# Patient Record
Sex: Male | Born: 1940
Health system: Southern US, Community
[De-identification: ages and names within clinical notes are randomized; demographics above are authoritative.]

## PROBLEM LIST (undated history)

## (undated) DIAGNOSIS — I10 Essential (primary) hypertension: Secondary | ICD-10-CM

## (undated) DIAGNOSIS — J449 Chronic obstructive pulmonary disease, unspecified: Secondary | ICD-10-CM

## (undated) DIAGNOSIS — E119 Type 2 diabetes mellitus without complications: Secondary | ICD-10-CM

## (undated) HISTORY — DX: Type 2 diabetes mellitus without complications: E11.9

## (undated) HISTORY — DX: Essential (primary) hypertension: I10

## (undated) HISTORY — DX: Chronic obstructive pulmonary disease, unspecified: J44.9

---

## 1998-05-28 ENCOUNTER — Other Ambulatory Visit: Admission: RE | Admit: 1998-05-28 | Discharge: 1998-05-28 | Payer: Self-pay | Admitting: Urology

## 1998-06-13 ENCOUNTER — Encounter: Payer: Self-pay | Admitting: Urology

## 1998-06-18 ENCOUNTER — Inpatient Hospital Stay (HOSPITAL_COMMUNITY): Admission: RE | Admit: 1998-06-18 | Discharge: 1998-06-22 | Payer: Self-pay | Admitting: Urology

## 1998-07-06 ENCOUNTER — Emergency Department (HOSPITAL_COMMUNITY): Admission: EM | Admit: 1998-07-06 | Discharge: 1998-07-06 | Payer: Self-pay | Admitting: Emergency Medicine

## 1998-07-06 ENCOUNTER — Emergency Department (HOSPITAL_COMMUNITY): Admission: EM | Admit: 1998-07-06 | Discharge: 1998-07-06 | Payer: Self-pay

## 1998-07-07 ENCOUNTER — Emergency Department (HOSPITAL_COMMUNITY): Admission: EM | Admit: 1998-07-07 | Discharge: 1998-07-07 | Payer: Self-pay | Admitting: Emergency Medicine

## 1999-03-24 ENCOUNTER — Emergency Department (HOSPITAL_COMMUNITY): Admission: EM | Admit: 1999-03-24 | Discharge: 1999-03-24 | Payer: Self-pay | Admitting: Emergency Medicine

## 2001-09-17 ENCOUNTER — Emergency Department (HOSPITAL_COMMUNITY): Admission: EM | Admit: 2001-09-17 | Discharge: 2001-09-17 | Payer: Self-pay | Admitting: Emergency Medicine

## 2001-09-17 ENCOUNTER — Encounter: Payer: Self-pay | Admitting: Emergency Medicine

## 2002-01-11 ENCOUNTER — Emergency Department (HOSPITAL_COMMUNITY): Admission: EM | Admit: 2002-01-11 | Discharge: 2002-01-11 | Payer: Self-pay | Admitting: Emergency Medicine

## 2003-06-06 ENCOUNTER — Encounter (INDEPENDENT_AMBULATORY_CARE_PROVIDER_SITE_OTHER): Payer: Self-pay | Admitting: Specialist

## 2003-06-06 ENCOUNTER — Ambulatory Visit (HOSPITAL_COMMUNITY): Admission: RE | Admit: 2003-06-06 | Discharge: 2003-06-06 | Payer: Self-pay | Admitting: Gastroenterology

## 2003-08-03 ENCOUNTER — Emergency Department (HOSPITAL_COMMUNITY): Admission: EM | Admit: 2003-08-03 | Discharge: 2003-08-03 | Payer: Self-pay | Admitting: Emergency Medicine

## 2003-09-04 ENCOUNTER — Encounter: Admission: RE | Admit: 2003-09-04 | Discharge: 2003-09-04 | Payer: Self-pay | Admitting: Emergency Medicine

## 2004-03-15 ENCOUNTER — Inpatient Hospital Stay (HOSPITAL_COMMUNITY): Admission: EM | Admit: 2004-03-15 | Discharge: 2004-03-20 | Payer: Self-pay | Admitting: Emergency Medicine

## 2005-11-20 IMAGING — CR DG CHEST 2V
2 series · 2 of 2 positions shown · non-contrast
Comparison: none

CLINICAL DATA: fever; decreased appetite; dyspnea
 TWO VIEW CHEST 08/03/03
 Comparison to 06/13/98.

[view not recorded (1 of 2)]
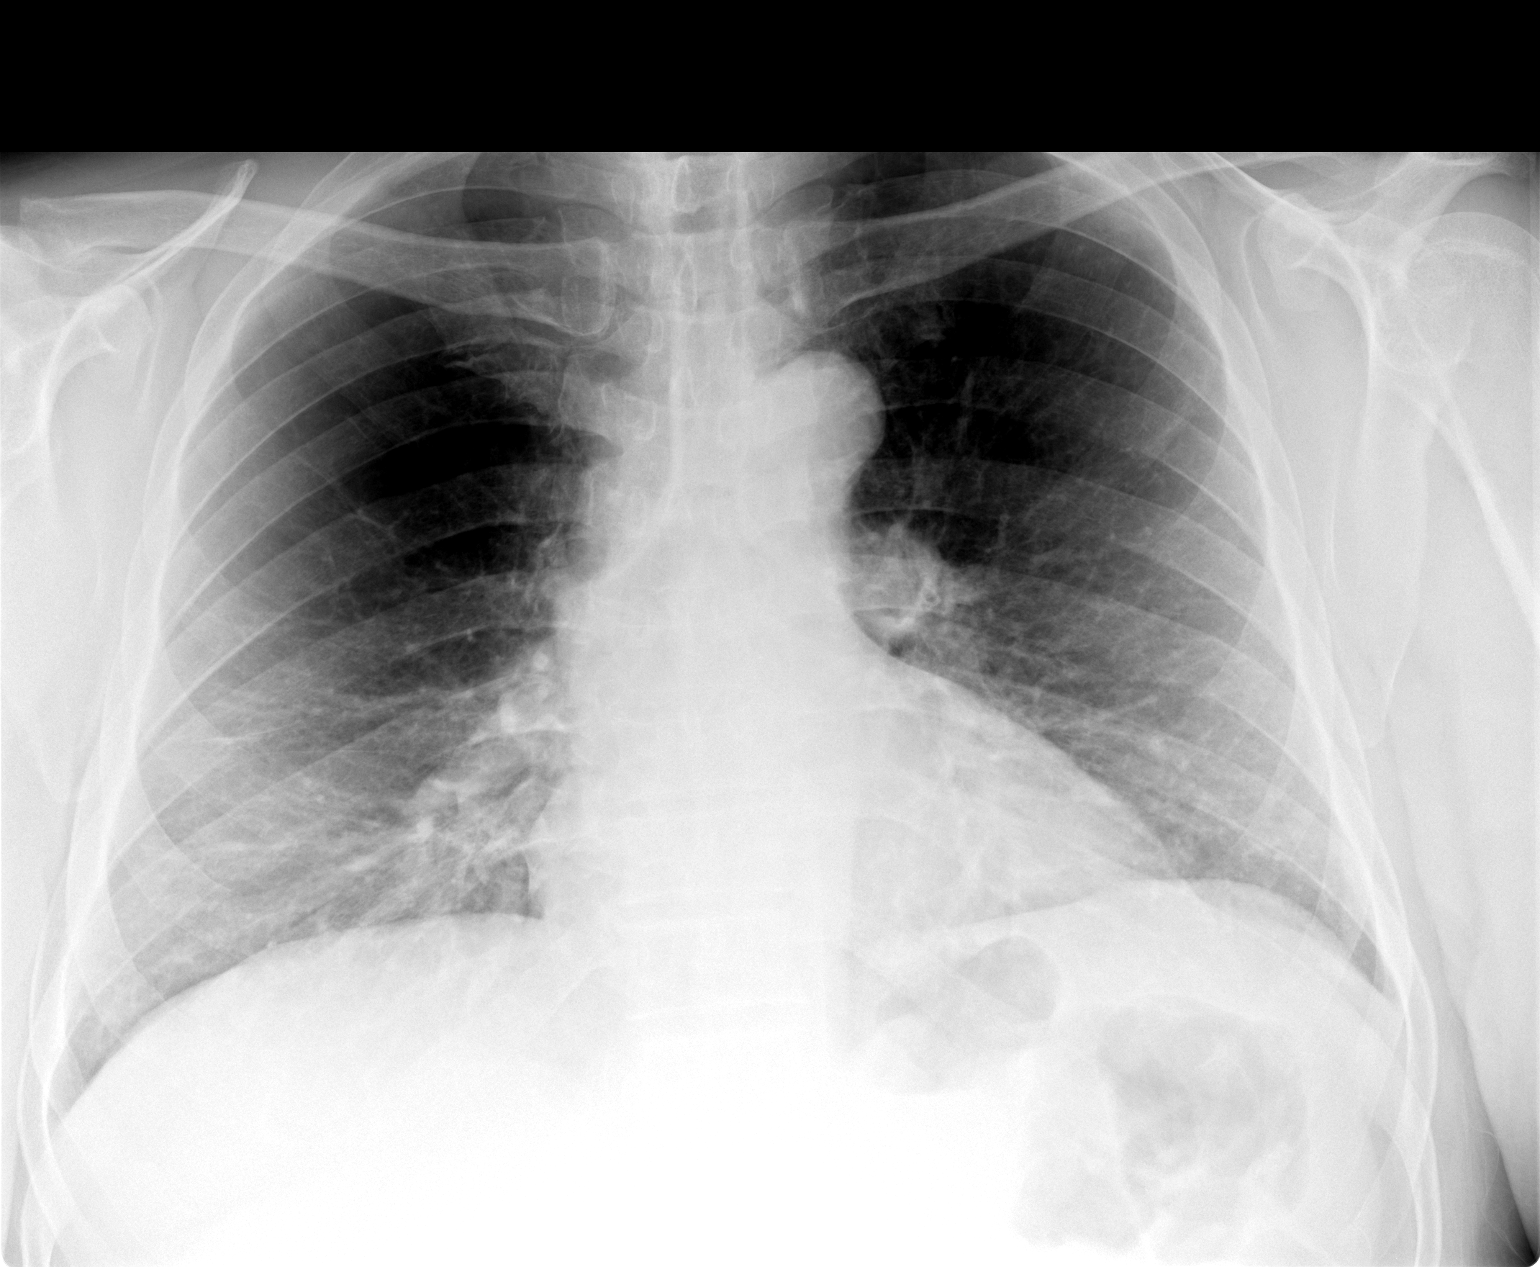

[view not recorded (2 of 2)]
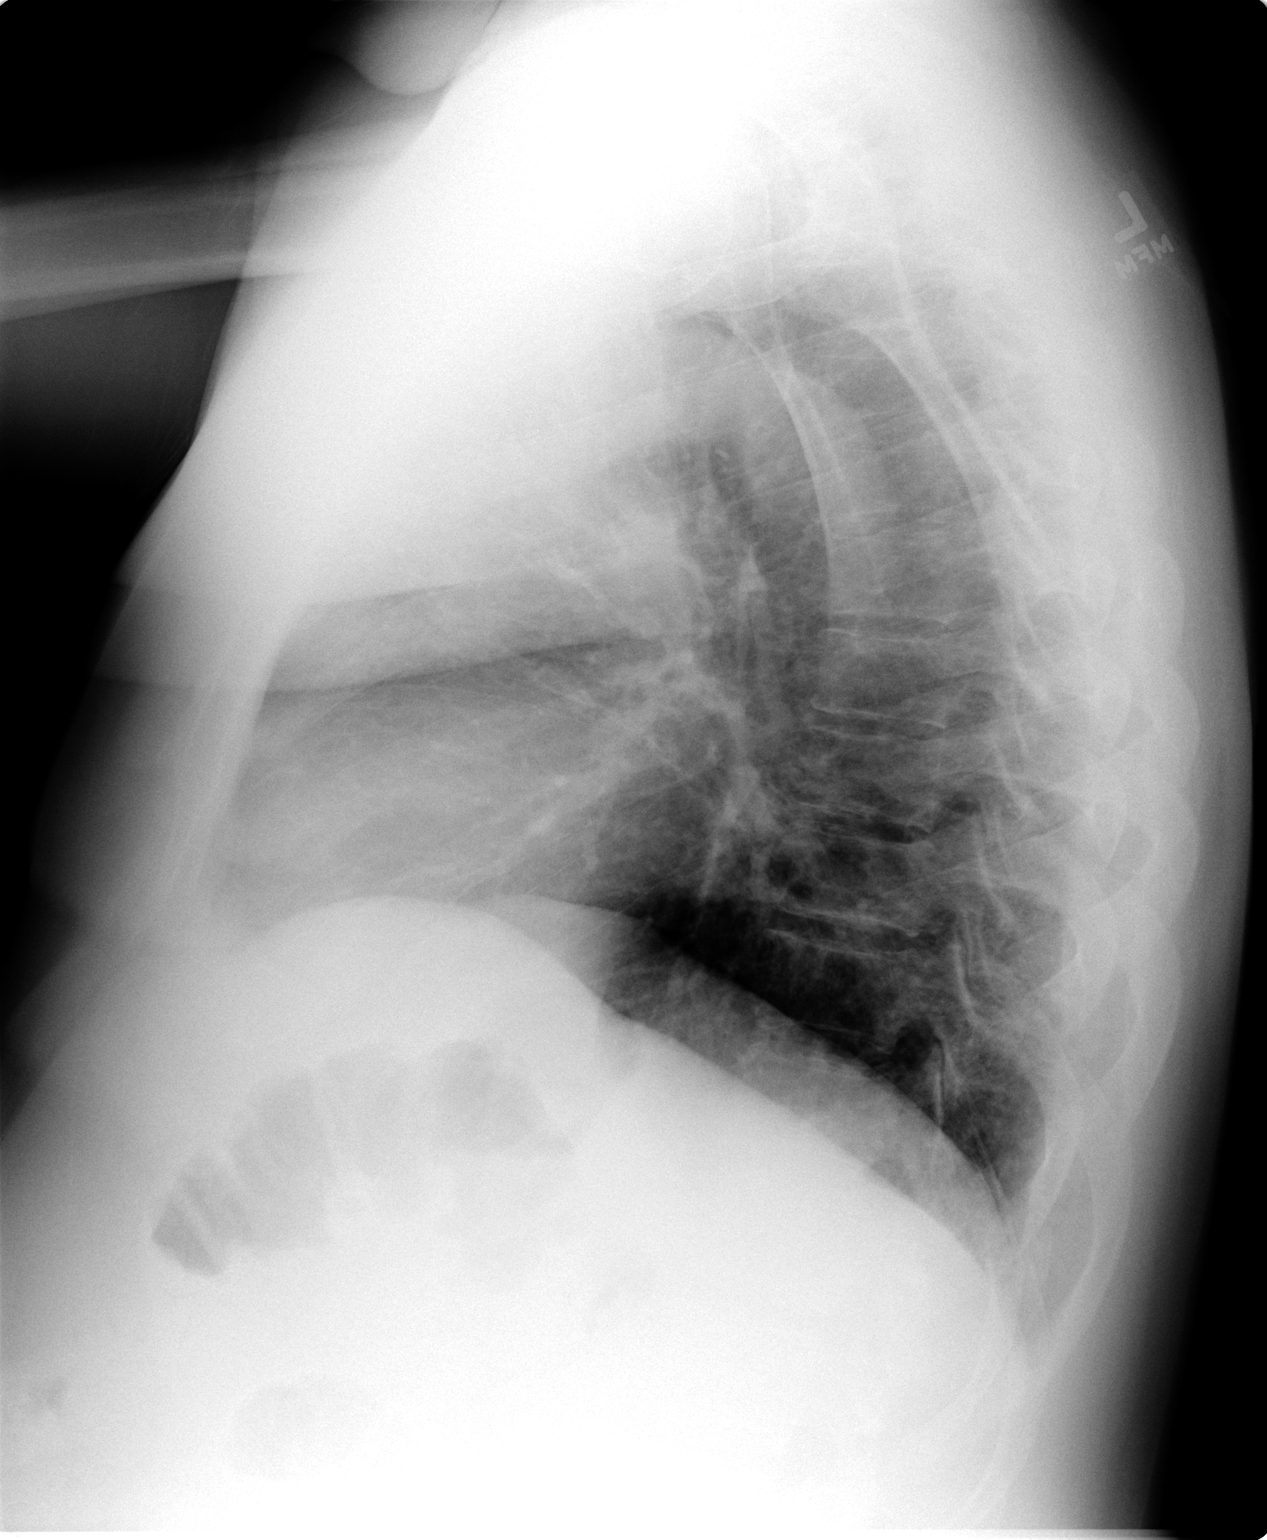

[2 of 2 positions shown; findings below may reference images not displayed]

FINDINGS: Mild cardiomegaly and chronic peribronchial thickening and evidence of COPD noted.  There has been no interval change from the last study.
 IMPRESSION
 No evidence of acute cardiopulmonary disease.
 Stable cardiomegaly, chronic peribronchial thickening, COPD.

## 2007-05-08 ENCOUNTER — Encounter: Admission: RE | Admit: 2007-05-08 | Discharge: 2007-05-08 | Payer: Self-pay | Admitting: Orthopaedic Surgery

## 2007-05-09 ENCOUNTER — Encounter: Admission: RE | Admit: 2007-05-09 | Discharge: 2007-05-09 | Payer: Self-pay | Admitting: Orthopaedic Surgery

## 2009-01-13 ENCOUNTER — Encounter: Admission: RE | Admit: 2009-01-13 | Discharge: 2009-01-13 | Payer: Self-pay | Admitting: Family Medicine

## 2009-01-17 ENCOUNTER — Encounter: Admission: RE | Admit: 2009-01-17 | Discharge: 2009-01-17 | Payer: Self-pay | Admitting: Family Medicine

## 2009-02-14 ENCOUNTER — Encounter: Admission: RE | Admit: 2009-02-14 | Discharge: 2009-02-25 | Payer: Self-pay | Admitting: Orthopaedic Surgery

## 2009-06-09 ENCOUNTER — Emergency Department (HOSPITAL_COMMUNITY): Admission: EM | Admit: 2009-06-09 | Discharge: 2009-06-09 | Payer: Self-pay | Admitting: Emergency Medicine

## 2009-10-22 ENCOUNTER — Ambulatory Visit (HOSPITAL_COMMUNITY): Admission: RE | Admit: 2009-10-22 | Discharge: 2009-10-22 | Payer: Self-pay | Admitting: Neurosurgery

## 2009-11-21 ENCOUNTER — Inpatient Hospital Stay (HOSPITAL_COMMUNITY): Admission: RE | Admit: 2009-11-21 | Discharge: 2009-11-22 | Payer: Self-pay | Admitting: Neurosurgery

## 2010-07-04 LAB — BASIC METABOLIC PANEL
BUN: 15 mg/dL (ref 6–23)
CO2: 28 mEq/L (ref 19–32)
Calcium: 9.5 mg/dL (ref 8.4–10.5)
GFR calc Af Amer: >60 mL/min (ref >60)
GFR calc non Af Amer: >60 mL/min (ref >60)
Glucose, Bld: 138 mg/dL — ABNORMAL HIGH (ref 70–99)
Sodium: 141 mEq/L (ref 135–145)

## 2010-07-04 LAB — CBC
Hemoglobin: 14.3 g/dL (ref 13.0–17.0)
MCH: 30.8 pg (ref 26.0–34.0)
WBC: 8.2 10*3/uL (ref 4.0–10.5)

## 2010-07-04 LAB — SURGICAL PCR SCREEN
MRSA, PCR: NEGATIVE
Staphylococcus aureus: NEGATIVE

## 2010-07-05 LAB — BASIC METABOLIC PANEL
Calcium: 9.3 mg/dL (ref 8.4–10.5)
Chloride: 105 mEq/L (ref 96–112)
GFR calc non Af Amer: >60 mL/min (ref >60)
Potassium: 4.1 mEq/L (ref 3.5–5.1)

## 2010-07-05 LAB — CBC
MCH: 31.2 pg (ref 26.0–34.0)
MCV: 90.5 fL (ref 78.0–100.0)
Platelets: 210 10*3/uL (ref 150–400)
RDW: 12.8 % (ref 11.5–15.5)

## 2010-07-05 LAB — SURGICAL PCR SCREEN: Staphylococcus aureus: NEGATIVE

## 2010-09-04 NOTE — Op Note (Signed)
NAME:  Juan Romero, Juan Romero                          ACCOUNT NO.:  [id number]   MEDICAL RECORD NO.:  [id number]                   PATIENT TYPE:  AMB   LOCATION:  ENDO                                 FACILITY:  [facility]   PHYSICIAN:  John C. Madilyn Fireman, M.D.                 DATE OF BIRTH:  Jul 20, 1940   DATE OF PROCEDURE:  06/06/2003  DATE OF DISCHARGE:                                 OPERATIVE REPORT   PROCEDURE:  Colonoscopy with polypectomy.   INDICATIONS FOR PROCEDURE:  Colon cancer screening in a 70 year old patient  with no prior screening.   DESCRIPTION OF PROCEDURE:  The patient was placed in the left lateral  decubitus position then placed on the pulse monitor with continuous low flow  oxygen delivered by nasal cannula. He was sedated with 40 mcg IV fentanyl  and 4 mg IV Versed. The Olympus video colonoscope was inserted into the  rectum and advanced to the level of the ileocecal valve but despite multiple  position changes and abdominal pressure and torquing maneuvers, I could not  reach the extreme base of the cecum and the prep in that area seemed  somewhat suboptimal.  I could thus not rule out lesions within the base of  the cecum especially just proximal to the ileocecal valve.  Otherwise the  visualized portion of the cecum, ascending, transverse and descending colon  appeared normal with no polyps, masses, diverticula or other mucosal  abnormalities.  In the sigmoid colon, there was a pedunculated 1.5 cm polyp  removed by snare. The remainder of the sigmoid appeared normal.  At  approximately 12 mm in the rectum, there was a 6 mm polyp that was removed  by snare. The remainder of the rectum appeared normal.  The scope was then  withdrawn and the patient returned to the recovery room in stable condition.  He tolerated the procedure well and there were no immediate complications.   IMPRESSION:  Sigmoid and rectal polyp.   PLAN:  Await histology to determine method and interval for  future colon  screening.                                               John C. Madilyn Fireman, M.D.    JCH/MEDQ  D:  06/06/2003  T:  06/06/2003  Job:  [id number]   cc:   Jethro Bastos, M.D.  [address]  [place]  [place] [address]  Fax: [phone]

## 2010-09-04 NOTE — Discharge Summary (Signed)
Juan, Romero NO.:  [id number]   MEDICAL RECORD NO.:  [id number]          PATIENT TYPE:  INP   LOCATION:  0361                         FACILITY:  [facility]   PHYSICIAN:  Sherin Quarry, MD      DATE OF BIRTH:  01/23/1941   DATE OF ADMISSION:  03/15/2004  DATE OF DISCHARGE:                                 DISCHARGE SUMMARY   REASON FOR ADMISSION:  Juan Romero is a very pleasant 70 year old man who  is generally in good health.  He seems to be very compliant with his  medications in general.  He initially presented on November 27 with a 24-  hour history of chills, about a four or five day history of cloudy urine and  with temperatures up to 104 on the day of admission.  There was no  associated nausea, vomiting, back pain or respiratory complaints.  In the  emergency room, studies obtained included a urinalysis which showed moderate  hemorrhage, was positive for nitrites had large leukocyte esterase.  The  microscopic exam revealed evidence of pyuria.  The patient's white count was  13,700, hemoglobin 13.7.  He was, therefore, admitted with the presumptive  diagnosis of urinary tract infection with pyelonephritis.   Subsequently, urine culture grew greater than 100,000 colonies of E coli  which was sensitive to all antibiotics tested.  One set of blood cultures  also grew E coli with the same sensitivities.  An ultrasound was obtained  which was normal of the kidneys.   HOSPITAL COURSE:  On admission, Dr. Kevan Romero placed the patient on IV normal  saline with 10 mEq of KCl at 100 ml/hour, Cipro 400 mg IV every 12 hours was  begun.  His usual medications were continued.  On November 28, the patient  had an episode of dyspnea.  This may have been secondary to bacteremia.  A  chest x-ray was obtained and was normal.  At that time to provide  comprehensive coverage for possible pathogens, Dr. Ladona Romero added Zosyn 3.375  g every six hours.  When the sensitivities on  the blood and urine islets  were obtained on November 30, the patient's Zosyn was discontinued and Cipro  was continued.  By December 2, the patient was afebrile.  He felt well.  He  was having no nausea, no back pain, no dysuria, or hematuria.  He was having  no difficulty urinating.  For this reason, he appeared to be a candidate for  discharge.   At the time of discharge, I emphasized to him the importance of taking  antibiotic medication until it was all completed.   DISCHARGE DIAGNOSES:  1.  Urinary tract infection of probable prostate origin with associated gram      negative sepsis.  2.  Hypertension.  3.  Type 2 diabetes.  4.  History of prostate cancer, status post surgical treatment per Dr.      Aldean Romero.  5.  Obesity.   DISCHARGE MEDICATIONS:  The patient will continue his usual medicines which  consist of:  1.  Lisinopril 20 mg daily.  2.  Glucotrol  XL 5 mg daily.  3.  Maxzide 37.5/25 one daily.  4.  Aspirin one-half tablet daily.  5.  Allegra 60 mg daily.  6.  Metformin 500 mg b.i.d.  7.  Lopid 600 mg b.i.d.  8.  In addition, he will continue Cipro at a dose of 500 mg p.o. b.i.d. for      two additional weeks because of the likely prostatic source of this      infection.   DISCHARGE INSTRUCTIONS:  He was advised to continue to see Dr. Aldean Romero on  a regular basis. He was also advised to keep his planned appointment with  Dr. Dorothe Romero in three weeks.  He was encouraged to return to the hospital if  he should have any  recurrence of fever or urinary tract symptoms.      SY/MEDQ  D:  03/20/2004  T:  03/20/2004  Job:  [id number]   cc:   Juan Romero, M.D.  [address]  [place]  [place] [address]  Fax: [phone]   Juan Romero., M.D.  [address]  [place]  [place] [address]  Fax: [phone]
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
## 2011-09-20 DIAGNOSIS — E119 Type 2 diabetes mellitus without complications: Secondary | ICD-10-CM | POA: Diagnosis not present

## 2011-09-20 DIAGNOSIS — E785 Hyperlipidemia, unspecified: Secondary | ICD-10-CM | POA: Diagnosis not present

## 2011-09-20 DIAGNOSIS — I10 Essential (primary) hypertension: Secondary | ICD-10-CM | POA: Diagnosis not present

## 2011-09-20 DIAGNOSIS — L219 Seborrheic dermatitis, unspecified: Secondary | ICD-10-CM | POA: Diagnosis not present

## 2011-09-20 DIAGNOSIS — C61 Malignant neoplasm of prostate: Secondary | ICD-10-CM | POA: Diagnosis not present

## 2011-09-20 DIAGNOSIS — J449 Chronic obstructive pulmonary disease, unspecified: Secondary | ICD-10-CM | POA: Diagnosis not present

## 2011-09-20 DIAGNOSIS — M5126 Other intervertebral disc displacement, lumbar region: Secondary | ICD-10-CM | POA: Diagnosis not present

## 2011-09-20 DIAGNOSIS — J45909 Unspecified asthma, uncomplicated: Secondary | ICD-10-CM | POA: Diagnosis not present

## 2011-10-27 DIAGNOSIS — L280 Lichen simplex chronicus: Secondary | ICD-10-CM | POA: Diagnosis not present

## 2011-11-22 DIAGNOSIS — E119 Type 2 diabetes mellitus without complications: Secondary | ICD-10-CM | POA: Diagnosis not present

## 2011-11-29 DIAGNOSIS — L280 Lichen simplex chronicus: Secondary | ICD-10-CM | POA: Diagnosis not present

## 2012-02-09 IMAGING — CR DG CHEST 2V
2 series · 2 of 2 positions shown · non-contrast
Comparison: Plain film of the chest 03/16/2004.

CLINICAL DATA: Patient for lumbar surgery.  Cough.

CHEST - 2 VIEW

[view not recorded (1 of 2)]
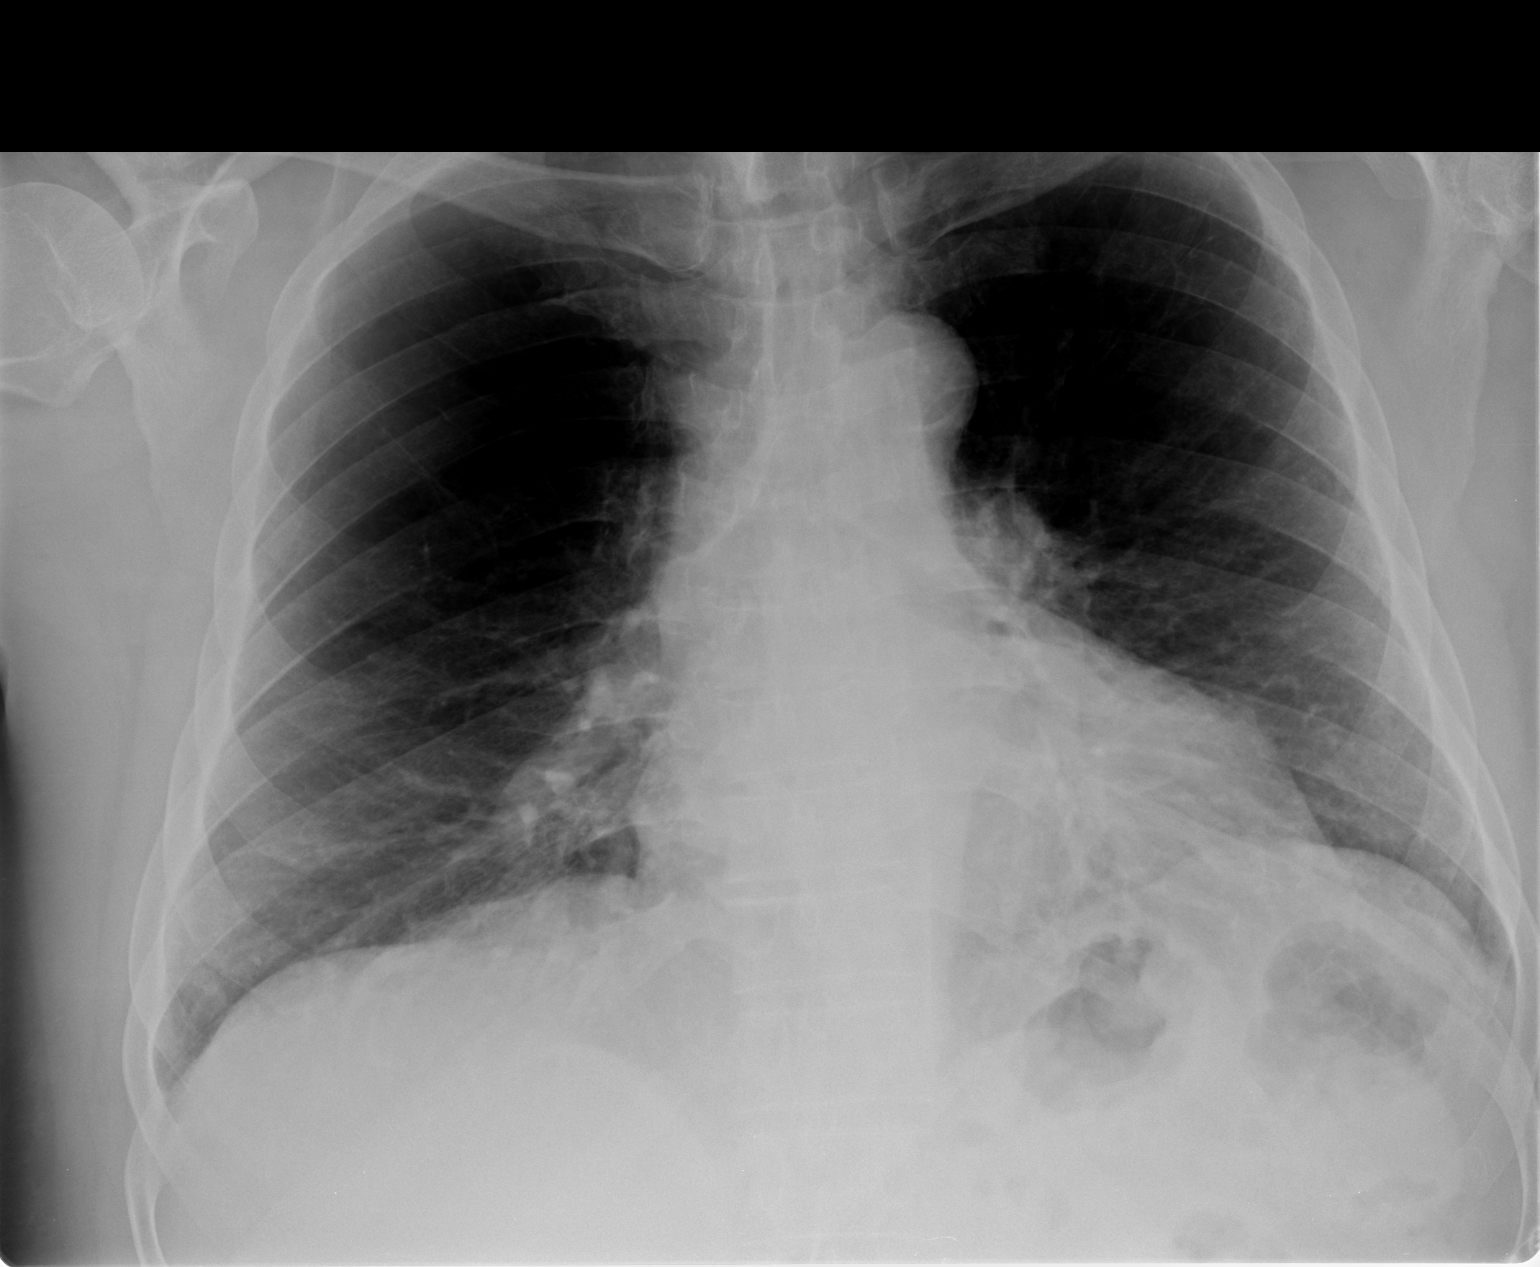

[view not recorded (2 of 2)]
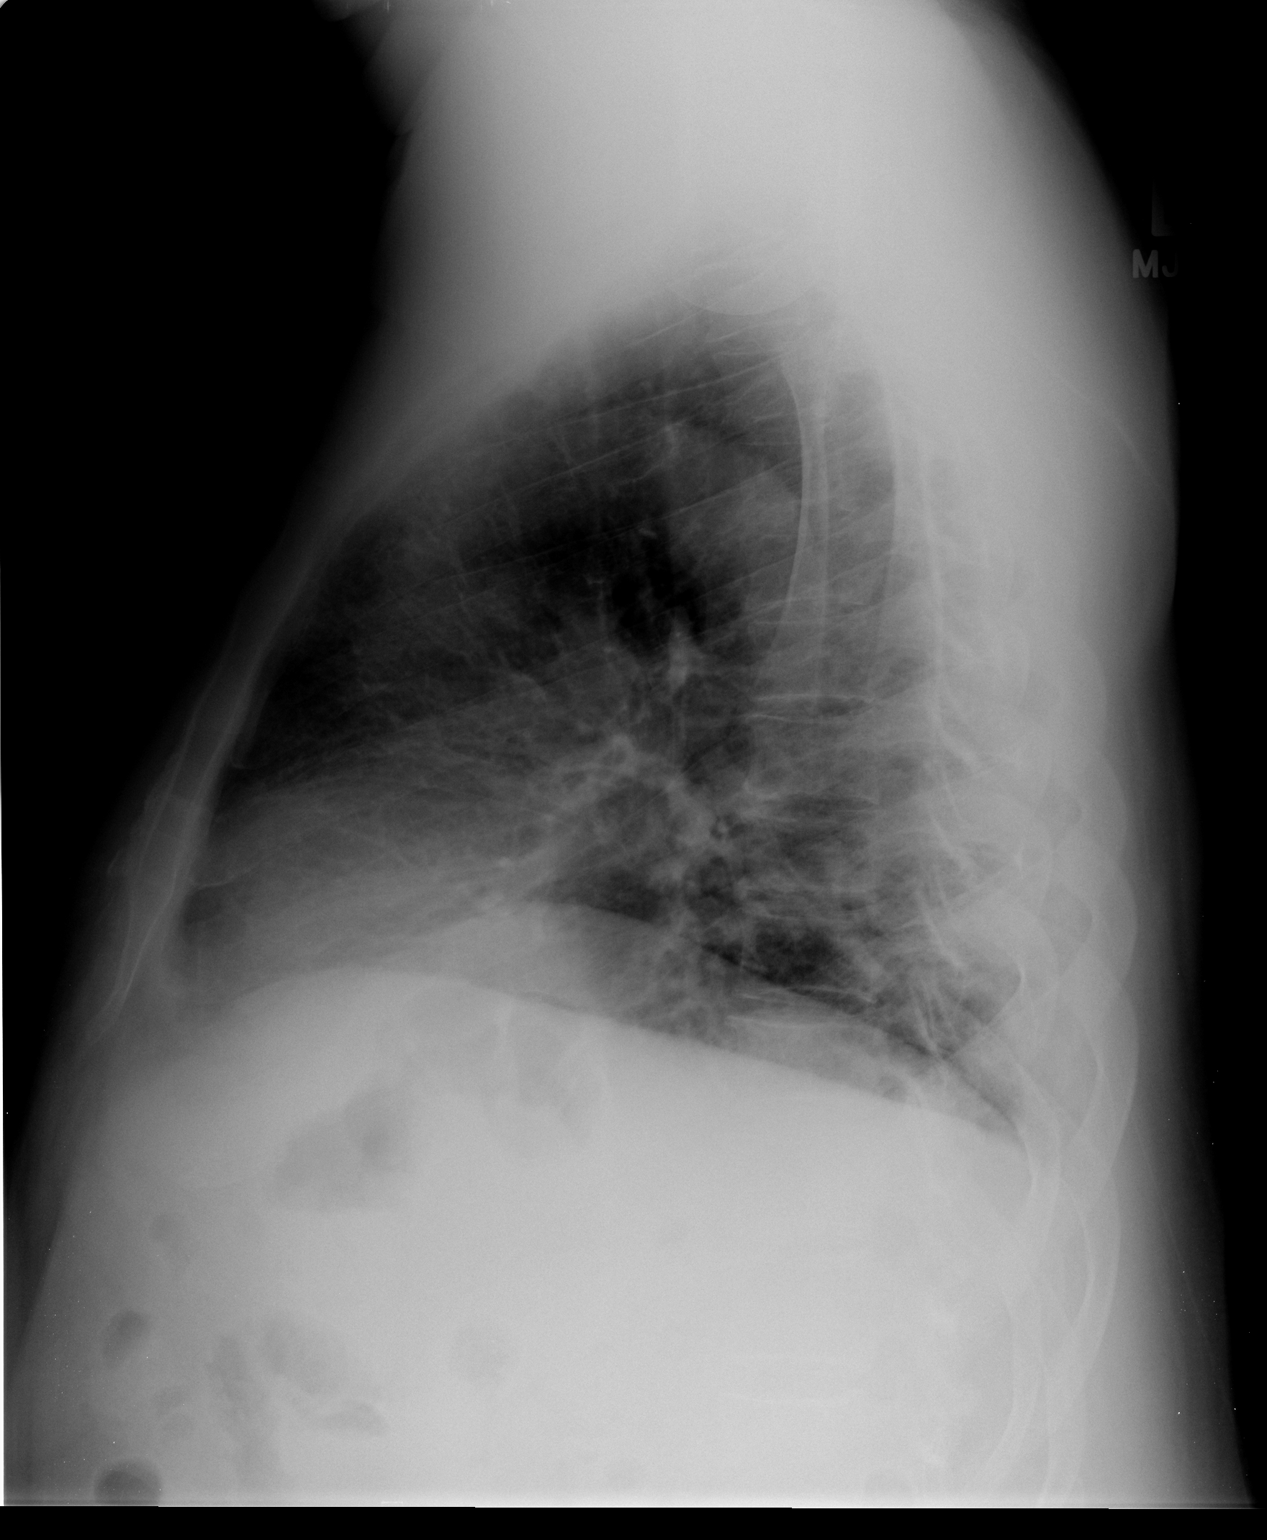

[2 of 2 positions shown; findings below may reference images not displayed]

FINDINGS: There is bullous change in the upper lung zones.  No
focal airspace disease or effusion.  Heart size upper normal.
IMPRESSION: Emphysema without acute disease.

## 2012-02-24 DIAGNOSIS — Z23 Encounter for immunization: Secondary | ICD-10-CM | POA: Diagnosis not present

## 2012-04-25 DIAGNOSIS — E782 Mixed hyperlipidemia: Secondary | ICD-10-CM | POA: Diagnosis not present

## 2012-04-25 DIAGNOSIS — C61 Malignant neoplasm of prostate: Secondary | ICD-10-CM | POA: Diagnosis not present

## 2012-04-25 DIAGNOSIS — Z79899 Other long term (current) drug therapy: Secondary | ICD-10-CM | POA: Diagnosis not present

## 2012-04-25 DIAGNOSIS — I10 Essential (primary) hypertension: Secondary | ICD-10-CM | POA: Diagnosis not present

## 2012-04-28 DIAGNOSIS — Z0000 Encounter for general adult medical examination without abnormal findings: Secondary | ICD-10-CM | POA: Diagnosis not present

## 2012-06-05 DIAGNOSIS — H6120 Impacted cerumen, unspecified ear: Secondary | ICD-10-CM | POA: Diagnosis not present

## 2012-06-05 DIAGNOSIS — H9209 Otalgia, unspecified ear: Secondary | ICD-10-CM | POA: Diagnosis not present

## 2012-06-07 DIAGNOSIS — H6980 Other specified disorders of Eustachian tube, unspecified ear: Secondary | ICD-10-CM | POA: Diagnosis not present

## 2012-06-07 DIAGNOSIS — H905 Unspecified sensorineural hearing loss: Secondary | ICD-10-CM | POA: Diagnosis not present

## 2012-07-05 DIAGNOSIS — H905 Unspecified sensorineural hearing loss: Secondary | ICD-10-CM | POA: Diagnosis not present

## 2012-07-05 DIAGNOSIS — H6980 Other specified disorders of Eustachian tube, unspecified ear: Secondary | ICD-10-CM | POA: Diagnosis not present

## 2012-07-21 DIAGNOSIS — H903 Sensorineural hearing loss, bilateral: Secondary | ICD-10-CM | POA: Diagnosis not present

## 2012-10-17 DIAGNOSIS — Z6837 Body mass index (BMI) 37.0-37.9, adult: Secondary | ICD-10-CM | POA: Diagnosis not present

## 2012-10-17 DIAGNOSIS — I10 Essential (primary) hypertension: Secondary | ICD-10-CM | POA: Diagnosis not present

## 2012-10-17 DIAGNOSIS — Z79899 Other long term (current) drug therapy: Secondary | ICD-10-CM | POA: Diagnosis not present

## 2012-10-17 DIAGNOSIS — C61 Malignant neoplasm of prostate: Secondary | ICD-10-CM | POA: Diagnosis not present

## 2012-10-17 DIAGNOSIS — E785 Hyperlipidemia, unspecified: Secondary | ICD-10-CM | POA: Diagnosis not present

## 2012-10-24 DIAGNOSIS — R42 Dizziness and giddiness: Secondary | ICD-10-CM | POA: Diagnosis not present

## 2012-10-24 DIAGNOSIS — I10 Essential (primary) hypertension: Secondary | ICD-10-CM | POA: Diagnosis not present

## 2012-11-18 DIAGNOSIS — H1045 Other chronic allergic conjunctivitis: Secondary | ICD-10-CM | POA: Diagnosis not present

## 2012-11-21 DIAGNOSIS — R634 Abnormal weight loss: Secondary | ICD-10-CM | POA: Diagnosis not present

## 2012-11-21 DIAGNOSIS — I10 Essential (primary) hypertension: Secondary | ICD-10-CM | POA: Diagnosis not present

## 2013-02-01 DIAGNOSIS — Z23 Encounter for immunization: Secondary | ICD-10-CM | POA: Diagnosis not present

## 2013-03-25 DIAGNOSIS — J019 Acute sinusitis, unspecified: Secondary | ICD-10-CM | POA: Diagnosis not present

## 2013-05-15 DIAGNOSIS — L280 Lichen simplex chronicus: Secondary | ICD-10-CM | POA: Diagnosis not present

## 2013-06-07 DIAGNOSIS — Z8546 Personal history of malignant neoplasm of prostate: Secondary | ICD-10-CM | POA: Diagnosis not present

## 2013-06-07 DIAGNOSIS — Z79899 Other long term (current) drug therapy: Secondary | ICD-10-CM | POA: Diagnosis not present

## 2013-06-07 DIAGNOSIS — E119 Type 2 diabetes mellitus without complications: Secondary | ICD-10-CM | POA: Diagnosis not present

## 2013-06-07 DIAGNOSIS — E782 Mixed hyperlipidemia: Secondary | ICD-10-CM | POA: Diagnosis not present

## 2013-06-07 DIAGNOSIS — Z0000 Encounter for general adult medical examination without abnormal findings: Secondary | ICD-10-CM | POA: Diagnosis not present

## 2013-06-07 DIAGNOSIS — C61 Malignant neoplasm of prostate: Secondary | ICD-10-CM | POA: Diagnosis not present

## 2013-06-30 DIAGNOSIS — E119 Type 2 diabetes mellitus without complications: Secondary | ICD-10-CM | POA: Diagnosis not present

## 2013-06-30 DIAGNOSIS — H40039 Anatomical narrow angle, unspecified eye: Secondary | ICD-10-CM | POA: Diagnosis not present

## 2013-07-23 ENCOUNTER — Encounter: Payer: Self-pay | Admitting: Podiatry

## 2013-07-23 ENCOUNTER — Ambulatory Visit (INDEPENDENT_AMBULATORY_CARE_PROVIDER_SITE_OTHER): Payer: Medicare Other

## 2013-07-23 ENCOUNTER — Ambulatory Visit (INDEPENDENT_AMBULATORY_CARE_PROVIDER_SITE_OTHER): Payer: Medicare Other | Admitting: Podiatry

## 2013-07-23 VITALS — BP 131/72 | HR 89 | Resp 12

## 2013-07-23 DIAGNOSIS — M65979 Unspecified synovitis and tenosynovitis, unspecified ankle and foot: Secondary | ICD-10-CM

## 2013-07-23 DIAGNOSIS — R52 Pain, unspecified: Secondary | ICD-10-CM | POA: Diagnosis not present

## 2013-07-23 DIAGNOSIS — M659 Synovitis and tenosynovitis, unspecified: Secondary | ICD-10-CM | POA: Diagnosis not present

## 2013-07-23 NOTE — Progress Notes (Signed)
   Subjective:    Patient ID: Juan Romero, male    DOB: Jun 19, 1940, 73 y.o.   MRN: [id number]  HPI PT STATED RT FOOT ANKLE AND HEEL IS SWOLLEN AND SORE FOR 6 YEARS. THE ANKLE AND FOOT IS GETTING WORSE. THE FOOT GET AGGRAVATED BY WALKING AND STANDING. TRIED SOAK AT NIGHT WITH EPSON SALT AND IT HELP SOME.    Review of Systems  HENT: Positive for hearing loss and sinus pressure.   Eyes: Positive for visual disturbance.  All other systems reviewed and are negative.       Objective:   Physical Exam  Orientated x23 73 year old black male presents with his son  Vascular: DP and PT pulses 2/4 bilaterally  Neurological: Ankle reflexes reactive bilaterally. Sensation to 10 g monofilament wire intact 5/5 bilaterally. Vibratory sensation intact bilaterally.  Dermatological: Low-grade nonpitting edema noted about right ankle  Musculoskeletal: Low arch contour noted on off weightbearing. Upon weightbearing patient is not able to heel off on the right. The lateral borders of the right and left feet are straight. There is palpable tenderness in the retro-heel malleolar area right and the distal medial right lower leg over the tendon area.  X-ray report right ankle weightbearing  Intact bony structure without any fracture and/or dislocation or deformity noted.  Radiographic impression: No acute bony abnormality noted       Assessment & Plan:   Assessment: Satisfactory neurovascular status Medial right ankle tendinopathy  Plan: Patient placed in Cam Walker boot on the right leg to be worn with the exception of driving, showering, and sleeping,  Reappoint x6 weeks

## 2013-07-23 NOTE — Patient Instructions (Signed)
Wear the boot on the right leg except when showering, sleeping and driving. Limit standing walking is much is possible.

## 2013-07-24 ENCOUNTER — Encounter: Payer: Self-pay | Admitting: Podiatry

## 2013-07-27 DIAGNOSIS — H43399 Other vitreous opacities, unspecified eye: Secondary | ICD-10-CM | POA: Diagnosis not present

## 2013-07-27 DIAGNOSIS — H269 Unspecified cataract: Secondary | ICD-10-CM | POA: Diagnosis not present

## 2013-07-27 DIAGNOSIS — H2510 Age-related nuclear cataract, unspecified eye: Secondary | ICD-10-CM | POA: Diagnosis not present

## 2013-07-27 DIAGNOSIS — Z961 Presence of intraocular lens: Secondary | ICD-10-CM | POA: Diagnosis not present

## 2013-09-03 ENCOUNTER — Ambulatory Visit: Payer: No Typology Code available for payment source | Admitting: Podiatry

## 2013-09-11 DIAGNOSIS — E782 Mixed hyperlipidemia: Secondary | ICD-10-CM | POA: Diagnosis not present

## 2013-09-11 DIAGNOSIS — E119 Type 2 diabetes mellitus without complications: Secondary | ICD-10-CM | POA: Diagnosis not present

## 2013-09-11 DIAGNOSIS — I10 Essential (primary) hypertension: Secondary | ICD-10-CM | POA: Diagnosis not present

## 2013-09-12 ENCOUNTER — Ambulatory Visit (INDEPENDENT_AMBULATORY_CARE_PROVIDER_SITE_OTHER): Payer: Medicare Other | Admitting: Podiatry

## 2013-09-12 ENCOUNTER — Encounter: Payer: Self-pay | Admitting: Podiatry

## 2013-09-12 VITALS — BP 138/74 | HR 77 | Resp 12

## 2013-09-12 DIAGNOSIS — M659 Synovitis and tenosynovitis, unspecified: Secondary | ICD-10-CM

## 2013-09-12 NOTE — Patient Instructions (Signed)
Wear Cam Walker boot an additional 4 weeks.  Return for followup if pain continues.

## 2013-09-13 ENCOUNTER — Encounter: Payer: Self-pay | Admitting: Podiatry

## 2013-09-13 NOTE — Progress Notes (Signed)
Patient ID: Juan Romero, male   DOB: 04/09/1941, 73 y.o.   MRN: [id number]  Subjective: Orientated x7 73 year old black male presents after wearing Cam Walker boot on the right lower leg/foot as the visit of 07/24/2013. He states that his ankle is feeling better, howeve,r still a little sore.  Objective: Vascular: DP and PT pulses 2/4 bilaterally  Neurological: Ankle reflexes reactive bilaterally  Dermatological: Low-grade nonpitting edema medial right ankle noted  Musculoskeletal: Patient is able to heel off unilaterally and bilaterally. No foot deformities are noted No too many toes sign is noted bilaterally  There is palpable tenderness in the retro-medial malleolar area and medial navicular area on the right ankle/ foot.  Assessment: Reducing symptoms of medial right ankle tendinopathy Insertional posterior tibial tendinitis right  Plan: Continue to wear Cam Walker boot additional 4 weeks on the right leg/foot  Patient return for further evaluation if pain persists after an additional 4 weeks of immobilization.

## 2013-10-10 ENCOUNTER — Encounter: Payer: Self-pay | Admitting: Podiatry

## 2013-10-10 ENCOUNTER — Ambulatory Visit (INDEPENDENT_AMBULATORY_CARE_PROVIDER_SITE_OTHER): Payer: Medicare Other | Admitting: Podiatry

## 2013-10-10 ENCOUNTER — Other Ambulatory Visit: Payer: Self-pay | Admitting: Podiatry

## 2013-10-10 VITALS — BP 135/74 | HR 75 | Resp 16

## 2013-10-10 DIAGNOSIS — M659 Synovitis and tenosynovitis, unspecified: Secondary | ICD-10-CM

## 2013-10-10 NOTE — Patient Instructions (Signed)
Continue to wear boot on right foot The imaging center will contact you to schedule an MRI of the right rear foot and right ankle for ongoing right medial ankle pain

## 2013-10-10 NOTE — Progress Notes (Signed)
   Subjective:    Patient ID: Juan Romero, male    DOB: November 02, 1940, 73 y.o.   MRN: [id number]  HPI  Pt presents with right foot pain, states that he has had pain since last visit, the pain is in the arch area and is a burning sensation that increases with activity and when he is not using the boot., noted mild swelling. Pt has remained in boot since last visit.  Patient has been wearing Cam Walker boot on a consistent basis since 07/24/2013. He still complains of pain in the medial right lower leg and ankle area when walking without the boot. He says whenever he removes the boot walk without it areas uncomfortable. The pain in the medial right ankle has improved slightly since treatment was initiated however he still complains of some occasional burning and swelling especially in the morning.  Review of Systems     Objective:   Physical Exam  Orientated x3 space black male  Vascular: DP and PT pulses 2/4 bilaterally  Dermatological: No edema is noted in the right foot ankle area  Musculoskeletal: There is palpable tenderness in the retro-medial malleolar area distally to the navicular area on the right foot. This area duplicates area of patient's symptoms.  Patient is able to heel off weakly on the right Is able to heel off completely on left without any difficulty  The lateral borders of the right and left feet are straight when visualized from the posterior aspect.     Assessment & Plan:   Assessment: Persistent medial right ankle tendinopathy  Plan: At this time because patient symptoms have persisted have not improved significantly will refer for an MRI image noncontrast of the right ankle and right rear foot for the indication of painful medial right ankle and medial right forefoot.  Patient will maintain Cam Walker boot.  Our office will contact patient on receipt of MRI report

## 2013-10-12 ENCOUNTER — Other Ambulatory Visit: Payer: Self-pay

## 2013-10-17 ENCOUNTER — Other Ambulatory Visit: Payer: Self-pay

## 2013-10-17 ENCOUNTER — Ambulatory Visit
Admission: RE | Admit: 2013-10-17 | Discharge: 2013-10-17 | Disposition: A | Discharge status: Home / Self Care | Payer: Medicare Other | Source: Ambulatory Visit | Attending: Podiatry | Admitting: Podiatry

## 2013-10-17 DIAGNOSIS — M25879 Other specified joint disorders, unspecified ankle and foot: Secondary | ICD-10-CM | POA: Diagnosis not present

## 2013-10-17 DIAGNOSIS — M659 Synovitis and tenosynovitis, unspecified: Secondary | ICD-10-CM

## 2013-10-24 ENCOUNTER — Telehealth: Payer: Self-pay | Admitting: *Deleted

## 2013-10-24 NOTE — Telephone Encounter (Signed)
Had MRI done last Wednesday, yet to hear from you all regards to the results.  What's the hold up?  I called and informed him that Dr. Amalia Hailey reviewed his MRI and he wants you to come in to get the results.  He stated okay, what did it say.  I informed him I am not at liberty to say because I don't want to tell him anything wrong.  He stated I understand.  I transferred him to a scheduler.

## 2013-10-29 ENCOUNTER — Encounter: Payer: Self-pay | Admitting: Podiatry

## 2013-10-29 ENCOUNTER — Ambulatory Visit (INDEPENDENT_AMBULATORY_CARE_PROVIDER_SITE_OTHER): Payer: Medicare Other | Admitting: Podiatry

## 2013-10-29 VITALS — BP 154/88 | HR 86 | Resp 18 | Ht 70.5 in | Wt 260.0 lb

## 2013-10-29 DIAGNOSIS — M659 Synovitis and tenosynovitis, unspecified: Secondary | ICD-10-CM

## 2013-10-30 ENCOUNTER — Encounter: Payer: Self-pay | Admitting: Podiatry

## 2013-10-30 NOTE — Progress Notes (Signed)
Patient ID: Juan Romero, male   DOB: October 19, 1940, 73 y.o.   MRN: [id number] Subjective: This patient presents for ongoing medial right ankle pain. He has worn a Banker since 07/24/2013. The symptoms have decreased somewhat.  Objective: DP and PT pulses 2/4 bilaterally  Dermatological: No skin lesions are noted bilaterally  Musculoskeletal There is palpable tenderness in the retro-medial malleolar area right. Patient is having difficulty heeling off right  CLINICAL DATA: Right ankle medial greater than lateral  EXAM:  MRI OF THE RIGHT ANKLE WITHOUT CONTRAST  TECHNIQUE:  Multiplanar, multisequence MR imaging of the ankle was performed. No  intravenous contrast was administered.  COMPARISON: None.  FINDINGS:  TENDONS  Peroneal: Mild tendinosis of the peroneus brevis with a  short-segment longitudinal split tear and interstitial tear.  Peroneus longus and peroneus quartus intact.  Posteromedial: Moderate tendinosis of the posterior tibial tendon at  the level of the navicular. Intact flexor digitorum flexor hallucis  tendons.  Anterior: Intact.  Achilles: Intact.  Plantar Fascia: Intact.  LIGAMENTS  Lateral: Intact. Multiloculated small cystic structure adjacent to  the posterior talofibular ligament likely representing a ganglion  cysts.  Medial: Intact.  CARTILAGE  Ankle Joint: Small osteochondral lesion involving the medial corner  of the talar dome measuring 3.6 mm with overlying mild partial  thickness cartilage loss.  Subtalar Joints/Sinus Tarsi: Effacement of the normal sinus tarsi  fat. No subtalar joint effusion. Mild partial thickness cartilage  loss of the middle subtalar joint.  Bones: No focal marrow signal abnormality.  IMPRESSION:  1. Mild tendinosis of the peroneus brevis with a short-segment  longitudinal split tear and an interstitial component.  2. Moderate focal tendinosis of the posterior tibial tendon at the  level of the navicular.  3.  Multiloculated cystic structure adjacent to the posterior  talofibular ligament likely representing a ganglion cyst.  4. Small osteochondral lesion involving the medial corner of the  talar dome with overlying mild partial thickness cartilage loss.  5. Mild partial thickness cartilage loss of the middle subtalar  joint.    Assessment: See above MRI diagnosis including focal tendinosis of posterior tibial tendon  Plan: Reviewed MRI results with patient today DC Cam Walker boot Dispensed ankle stabilizer to wear on the right ankle when standing and walking   Reappoint x4 weeks

## 2013-11-26 ENCOUNTER — Ambulatory Visit (INDEPENDENT_AMBULATORY_CARE_PROVIDER_SITE_OTHER): Payer: Medicare Other | Admitting: Podiatry

## 2013-11-26 ENCOUNTER — Encounter: Payer: Self-pay | Admitting: Podiatry

## 2013-11-26 VITALS — BP 145/85 | HR 88 | Resp 15 | Ht 70.5 in | Wt 260.0 lb

## 2013-11-26 DIAGNOSIS — M659 Synovitis and tenosynovitis, unspecified: Secondary | ICD-10-CM

## 2013-11-26 NOTE — Progress Notes (Signed)
   Subjective:    Patient ID: Juan Romero, male    DOB: [birth year], 74 y.o.   MRN: [id number]  HPI This patient presents for ongoing right medial ankle pain. He has worn a Banker starting 07/24/2013.Currently is wearing an ankle stabilizer. The right medial ankle pain has improved.   Review of Systems  HENT: Positive for congestion and sinus pressure.   All other systems reviewed and are negative.      Objective:   Physical Exam  Orientated x3 black male  Vascular: DP and PT pulses 2/4 bilaterally  Neurological: Deferred  Musculoskeletal: Patient is able to heel loss unilaterally and bilaterally There is mild palpable tenderness in the medial right navicular area and right retro-medial malleolar area No foot deformity noted      Assessment & Plan:   Assessment: Improving right posterior tibial tendinopathy  Plan: Maintain ankle stabilizer on right foot on a continuous basis  Reappoint x2 months

## 2013-11-26 NOTE — Patient Instructions (Signed)
Continue to wear ankle stabilizer on right daily

## 2013-11-27 ENCOUNTER — Encounter: Payer: Self-pay | Admitting: Podiatry

## 2013-12-07 DIAGNOSIS — I10 Essential (primary) hypertension: Secondary | ICD-10-CM | POA: Diagnosis not present

## 2013-12-07 DIAGNOSIS — E782 Mixed hyperlipidemia: Secondary | ICD-10-CM | POA: Diagnosis not present

## 2013-12-07 DIAGNOSIS — E119 Type 2 diabetes mellitus without complications: Secondary | ICD-10-CM | POA: Diagnosis not present

## 2013-12-07 DIAGNOSIS — M722 Plantar fascial fibromatosis: Secondary | ICD-10-CM | POA: Diagnosis not present

## 2014-01-28 ENCOUNTER — Encounter: Payer: Self-pay | Admitting: Podiatry

## 2014-01-28 ENCOUNTER — Ambulatory Visit (INDEPENDENT_AMBULATORY_CARE_PROVIDER_SITE_OTHER): Payer: Medicare Other | Admitting: Podiatry

## 2014-01-28 VITALS — BP 182/92 | HR 93 | Resp 16

## 2014-01-28 DIAGNOSIS — M65879 Other synovitis and tenosynovitis, unspecified ankle and foot: Secondary | ICD-10-CM

## 2014-01-28 DIAGNOSIS — M722 Plantar fascial fibromatosis: Secondary | ICD-10-CM

## 2014-01-28 NOTE — Progress Notes (Signed)
   Subjective:    Patient ID: Juan Romero, male    DOB: 12/23/1940, 73 y.o.   MRN: [id number]  HPI This patient presents for follow care for right medial ankle pain which is been treated with Cam Walker immobilization and an ankle stabilizer. Patient wore ankle stabilizer for 1-2 weeks, however, he developed a burning sensation and discontinue the ankle stabilizer. The medial right ankle at this time is pain free. Patient is complaining of mid arch pain right. He has a existing pair of rigid orthotics which she wears which reduces the right mid arch pain. The orthotics are 71 years old and is requesting a replacement orthotic.   Review of Systems  All other systems reviewed and are negative.      Objective:   Physical Exam Orientated x3  Vascular: DP and PT pulses 2/4 bilaterally  Neurological: Ankle reflex equal and reactive bilaterally  Dermatological: No skin lesions noted  Musculoskeletal: Large contour noted bilaterally Patient is able to heel off easily unilaterally and bilaterally There is no palpable tenderness or edema in the right medial ankle area Palpable tenderness in the mid fascial band right which duplicates patient areas of discomfort         Assessment & Plan:   Assessment: Resolve right medial ankle tendinopathy Plantar fasciitis right  Plan: Digital scan obtained today for custom foot orthotics for the indication of plantar fasciitis right  the orthotics are rigid polypropylene 2D with extrinsic rear foot and incurvated 6 forefoot three-quarter length with final top cover.  Notify patient on receipt of custom foot orthotic

## 2014-01-29 ENCOUNTER — Encounter: Payer: Self-pay | Admitting: Podiatry

## 2014-02-12 DIAGNOSIS — Z23 Encounter for immunization: Secondary | ICD-10-CM | POA: Diagnosis not present

## 2014-02-18 ENCOUNTER — Encounter: Payer: Self-pay | Admitting: Podiatry

## 2014-02-18 ENCOUNTER — Ambulatory Visit (INDEPENDENT_AMBULATORY_CARE_PROVIDER_SITE_OTHER): Payer: Medicare Other | Admitting: Podiatry

## 2014-02-18 VITALS — BP 97/67 | HR 87 | Resp 15

## 2014-02-18 DIAGNOSIS — M722 Plantar fascial fibromatosis: Secondary | ICD-10-CM

## 2014-02-18 NOTE — Patient Instructions (Addendum)
The orthotics dispensed today do not contour satisfactorily I will contact the lab for replacement orthotics and notify you when they arrive ORTHOTIC INSTRUCTIONS   1) The shoe size most likely will increase 1/2 to one full size with your orthotic.  2) If the orthotic is full length, remove the shoe liner and replace it with your orthotic inside your shoe.  3) When purchasing new shoes, go midday and wear the thickness of socks you plan to wear with the shoe. Place orthotics in the shoes based on comfort. Orthotics may not fit in all shoe styles.  4) Wear the orthotic as long as they are comfortable and then remove. Restart the next day and continue this until you can wear the orthotic comfortably all day.

## 2014-02-19 NOTE — Progress Notes (Signed)
Patient ID: Juan Romero, male   DOB: Jun 29, 1940, 73 y.o.   MRN: [id number]  Subjective: This patient presents for dispensing of custom foot orthotics  Objective: Orthotics do not contour satisfactorily The heels are too narrow  Assessment: Unsatisfactory fit of custom orthotics  Plan: Contact lab for replacement orthotics  Notify patient upon released seated of custom orthotics

## 2014-03-11 ENCOUNTER — Encounter: Payer: Self-pay | Admitting: Podiatry

## 2014-06-20 DIAGNOSIS — M5126 Other intervertebral disc displacement, lumbar region: Secondary | ICD-10-CM | POA: Diagnosis not present

## 2014-06-20 DIAGNOSIS — J4520 Mild intermittent asthma, uncomplicated: Secondary | ICD-10-CM | POA: Diagnosis not present

## 2014-06-20 DIAGNOSIS — Z79899 Other long term (current) drug therapy: Secondary | ICD-10-CM | POA: Diagnosis not present

## 2014-06-20 DIAGNOSIS — E782 Mixed hyperlipidemia: Secondary | ICD-10-CM | POA: Diagnosis not present

## 2014-06-20 DIAGNOSIS — C61 Malignant neoplasm of prostate: Secondary | ICD-10-CM | POA: Diagnosis not present

## 2014-06-20 DIAGNOSIS — Z23 Encounter for immunization: Secondary | ICD-10-CM | POA: Diagnosis not present

## 2014-06-20 DIAGNOSIS — Z0000 Encounter for general adult medical examination without abnormal findings: Secondary | ICD-10-CM | POA: Diagnosis not present

## 2014-06-20 DIAGNOSIS — E785 Hyperlipidemia, unspecified: Secondary | ICD-10-CM | POA: Diagnosis not present

## 2014-06-20 DIAGNOSIS — E1165 Type 2 diabetes mellitus with hyperglycemia: Secondary | ICD-10-CM | POA: Diagnosis not present

## 2014-06-20 DIAGNOSIS — J449 Chronic obstructive pulmonary disease, unspecified: Secondary | ICD-10-CM | POA: Diagnosis not present

## 2014-06-20 DIAGNOSIS — I10 Essential (primary) hypertension: Secondary | ICD-10-CM | POA: Diagnosis not present

## 2014-09-09 DIAGNOSIS — H9202 Otalgia, left ear: Secondary | ICD-10-CM | POA: Diagnosis not present

## 2014-09-19 DIAGNOSIS — H6522 Chronic serous otitis media, left ear: Secondary | ICD-10-CM | POA: Diagnosis not present

## 2014-12-24 DIAGNOSIS — E782 Mixed hyperlipidemia: Secondary | ICD-10-CM | POA: Diagnosis not present

## 2014-12-24 DIAGNOSIS — Z23 Encounter for immunization: Secondary | ICD-10-CM | POA: Diagnosis not present

## 2014-12-24 DIAGNOSIS — I10 Essential (primary) hypertension: Secondary | ICD-10-CM | POA: Diagnosis not present

## 2014-12-24 DIAGNOSIS — E1165 Type 2 diabetes mellitus with hyperglycemia: Secondary | ICD-10-CM | POA: Diagnosis not present

## 2015-02-13 DIAGNOSIS — R0989 Other specified symptoms and signs involving the circulatory and respiratory systems: Secondary | ICD-10-CM | POA: Diagnosis not present

## 2015-02-13 DIAGNOSIS — J440 Chronic obstructive pulmonary disease with acute lower respiratory infection: Secondary | ICD-10-CM | POA: Diagnosis not present

## 2015-03-12 DIAGNOSIS — Z09 Encounter for follow-up examination after completed treatment for conditions other than malignant neoplasm: Secondary | ICD-10-CM | POA: Diagnosis not present

## 2015-03-12 DIAGNOSIS — Z86010 Personal history of colonic polyps: Secondary | ICD-10-CM | POA: Diagnosis not present

## 2015-03-12 DIAGNOSIS — Z1211 Encounter for screening for malignant neoplasm of colon: Secondary | ICD-10-CM | POA: Diagnosis not present

## 2015-07-14 DIAGNOSIS — L723 Sebaceous cyst: Secondary | ICD-10-CM | POA: Diagnosis not present

## 2015-07-22 DIAGNOSIS — J329 Chronic sinusitis, unspecified: Secondary | ICD-10-CM | POA: Diagnosis not present

## 2015-08-13 DIAGNOSIS — Z79899 Other long term (current) drug therapy: Secondary | ICD-10-CM | POA: Diagnosis not present

## 2015-08-13 DIAGNOSIS — E785 Hyperlipidemia, unspecified: Secondary | ICD-10-CM | POA: Diagnosis not present

## 2015-08-13 DIAGNOSIS — Z0000 Encounter for general adult medical examination without abnormal findings: Secondary | ICD-10-CM | POA: Diagnosis not present

## 2015-08-13 DIAGNOSIS — E1165 Type 2 diabetes mellitus with hyperglycemia: Secondary | ICD-10-CM | POA: Diagnosis not present

## 2015-08-13 DIAGNOSIS — Z8546 Personal history of malignant neoplasm of prostate: Secondary | ICD-10-CM | POA: Diagnosis not present

## 2015-10-01 DIAGNOSIS — L723 Sebaceous cyst: Secondary | ICD-10-CM | POA: Diagnosis not present

## 2015-10-31 DIAGNOSIS — R634 Abnormal weight loss: Secondary | ICD-10-CM | POA: Diagnosis not present

## 2015-10-31 DIAGNOSIS — J309 Allergic rhinitis, unspecified: Secondary | ICD-10-CM | POA: Diagnosis not present

## 2015-10-31 DIAGNOSIS — I951 Orthostatic hypotension: Secondary | ICD-10-CM | POA: Diagnosis not present

## 2015-12-24 DIAGNOSIS — Z23 Encounter for immunization: Secondary | ICD-10-CM | POA: Diagnosis not present

## 2015-12-24 DIAGNOSIS — Z125 Encounter for screening for malignant neoplasm of prostate: Secondary | ICD-10-CM | POA: Diagnosis not present

## 2015-12-24 DIAGNOSIS — R634 Abnormal weight loss: Secondary | ICD-10-CM | POA: Diagnosis not present

## 2015-12-24 DIAGNOSIS — E46 Unspecified protein-calorie malnutrition: Secondary | ICD-10-CM | POA: Diagnosis not present

## 2015-12-24 DIAGNOSIS — E1165 Type 2 diabetes mellitus with hyperglycemia: Secondary | ICD-10-CM | POA: Diagnosis not present

## 2015-12-26 DIAGNOSIS — E1165 Type 2 diabetes mellitus with hyperglycemia: Secondary | ICD-10-CM | POA: Diagnosis not present

## 2016-01-14 DIAGNOSIS — R634 Abnormal weight loss: Secondary | ICD-10-CM | POA: Diagnosis not present

## 2016-01-15 DIAGNOSIS — I959 Hypotension, unspecified: Secondary | ICD-10-CM | POA: Diagnosis not present

## 2016-02-20 DIAGNOSIS — Z6834 Body mass index (BMI) 34.0-34.9, adult: Secondary | ICD-10-CM | POA: Diagnosis not present

## 2016-02-20 DIAGNOSIS — Z7984 Long term (current) use of oral hypoglycemic drugs: Secondary | ICD-10-CM | POA: Diagnosis not present

## 2016-02-20 DIAGNOSIS — E1165 Type 2 diabetes mellitus with hyperglycemia: Secondary | ICD-10-CM | POA: Diagnosis not present

## 2016-03-17 DIAGNOSIS — E119 Type 2 diabetes mellitus without complications: Secondary | ICD-10-CM | POA: Diagnosis not present

## 2016-03-17 DIAGNOSIS — H40033 Anatomical narrow angle, bilateral: Secondary | ICD-10-CM | POA: Diagnosis not present

## 2016-08-06 DIAGNOSIS — H6521 Chronic serous otitis media, right ear: Secondary | ICD-10-CM | POA: Diagnosis not present

## 2016-08-06 DIAGNOSIS — H6991 Unspecified Eustachian tube disorder, right ear: Secondary | ICD-10-CM | POA: Diagnosis not present

## 2016-08-27 DIAGNOSIS — Z6836 Body mass index (BMI) 36.0-36.9, adult: Secondary | ICD-10-CM | POA: Diagnosis not present

## 2016-08-27 DIAGNOSIS — I10 Essential (primary) hypertension: Secondary | ICD-10-CM | POA: Diagnosis not present

## 2016-08-27 DIAGNOSIS — Z0000 Encounter for general adult medical examination without abnormal findings: Secondary | ICD-10-CM | POA: Diagnosis not present

## 2016-08-27 DIAGNOSIS — E1165 Type 2 diabetes mellitus with hyperglycemia: Secondary | ICD-10-CM | POA: Diagnosis not present

## 2016-08-27 DIAGNOSIS — J440 Chronic obstructive pulmonary disease with acute lower respiratory infection: Secondary | ICD-10-CM | POA: Diagnosis not present

## 2016-08-27 DIAGNOSIS — J4520 Mild intermittent asthma, uncomplicated: Secondary | ICD-10-CM | POA: Diagnosis not present

## 2016-08-27 DIAGNOSIS — Z7984 Long term (current) use of oral hypoglycemic drugs: Secondary | ICD-10-CM | POA: Diagnosis not present

## 2016-08-27 DIAGNOSIS — M25512 Pain in left shoulder: Secondary | ICD-10-CM | POA: Diagnosis not present

## 2016-08-27 DIAGNOSIS — Z79899 Other long term (current) drug therapy: Secondary | ICD-10-CM | POA: Diagnosis not present

## 2016-08-27 DIAGNOSIS — M5126 Other intervertebral disc displacement, lumbar region: Secondary | ICD-10-CM | POA: Diagnosis not present

## 2016-08-27 DIAGNOSIS — Z8546 Personal history of malignant neoplasm of prostate: Secondary | ICD-10-CM | POA: Diagnosis not present

## 2016-08-27 DIAGNOSIS — E782 Mixed hyperlipidemia: Secondary | ICD-10-CM | POA: Diagnosis not present

## 2017-01-03 DIAGNOSIS — H6993 Unspecified Eustachian tube disorder, bilateral: Secondary | ICD-10-CM | POA: Diagnosis not present

## 2017-01-14 DIAGNOSIS — Z23 Encounter for immunization: Secondary | ICD-10-CM | POA: Diagnosis not present

## 2017-03-04 DIAGNOSIS — E1165 Type 2 diabetes mellitus with hyperglycemia: Secondary | ICD-10-CM | POA: Diagnosis not present

## 2017-03-04 DIAGNOSIS — I10 Essential (primary) hypertension: Secondary | ICD-10-CM | POA: Diagnosis not present

## 2017-03-04 DIAGNOSIS — Z7984 Long term (current) use of oral hypoglycemic drugs: Secondary | ICD-10-CM | POA: Diagnosis not present

## 2017-03-04 DIAGNOSIS — Z23 Encounter for immunization: Secondary | ICD-10-CM | POA: Diagnosis not present

## 2017-05-18 DIAGNOSIS — J310 Chronic rhinitis: Secondary | ICD-10-CM | POA: Diagnosis not present

## 2017-05-18 DIAGNOSIS — H6993 Unspecified Eustachian tube disorder, bilateral: Secondary | ICD-10-CM | POA: Diagnosis not present

## 2017-07-13 DIAGNOSIS — J309 Allergic rhinitis, unspecified: Secondary | ICD-10-CM | POA: Diagnosis not present

## 2017-10-05 DIAGNOSIS — E782 Mixed hyperlipidemia: Secondary | ICD-10-CM | POA: Diagnosis not present

## 2017-10-05 DIAGNOSIS — E1165 Type 2 diabetes mellitus with hyperglycemia: Secondary | ICD-10-CM | POA: Diagnosis not present

## 2017-10-05 DIAGNOSIS — I10 Essential (primary) hypertension: Secondary | ICD-10-CM | POA: Diagnosis not present

## 2017-11-03 DIAGNOSIS — N182 Chronic kidney disease, stage 2 (mild): Secondary | ICD-10-CM | POA: Diagnosis not present

## 2017-11-03 DIAGNOSIS — I10 Essential (primary) hypertension: Secondary | ICD-10-CM | POA: Diagnosis not present

## 2017-11-03 DIAGNOSIS — E1129 Type 2 diabetes mellitus with other diabetic kidney complication: Secondary | ICD-10-CM | POA: Diagnosis not present

## 2017-11-03 DIAGNOSIS — E782 Mixed hyperlipidemia: Secondary | ICD-10-CM | POA: Diagnosis not present

## 2017-11-16 DIAGNOSIS — M71572 Other bursitis, not elsewhere classified, left ankle and foot: Secondary | ICD-10-CM | POA: Diagnosis not present

## 2017-11-16 DIAGNOSIS — M7732 Calcaneal spur, left foot: Secondary | ICD-10-CM | POA: Diagnosis not present

## 2017-11-16 DIAGNOSIS — M722 Plantar fascial fibromatosis: Secondary | ICD-10-CM | POA: Diagnosis not present

## 2017-11-23 DIAGNOSIS — M722 Plantar fascial fibromatosis: Secondary | ICD-10-CM | POA: Diagnosis not present

## 2017-11-23 DIAGNOSIS — M71572 Other bursitis, not elsewhere classified, left ankle and foot: Secondary | ICD-10-CM | POA: Diagnosis not present

## 2017-11-30 DIAGNOSIS — M722 Plantar fascial fibromatosis: Secondary | ICD-10-CM | POA: Diagnosis not present

## 2017-12-14 DIAGNOSIS — M722 Plantar fascial fibromatosis: Secondary | ICD-10-CM | POA: Diagnosis not present

## 2017-12-14 DIAGNOSIS — M19071 Primary osteoarthritis, right ankle and foot: Secondary | ICD-10-CM | POA: Diagnosis not present

## 2017-12-14 DIAGNOSIS — M71572 Other bursitis, not elsewhere classified, left ankle and foot: Secondary | ICD-10-CM | POA: Diagnosis not present

## 2018-01-24 DIAGNOSIS — Z23 Encounter for immunization: Secondary | ICD-10-CM | POA: Diagnosis not present

## 2018-02-28 DIAGNOSIS — Z79899 Other long term (current) drug therapy: Secondary | ICD-10-CM | POA: Diagnosis not present

## 2018-02-28 DIAGNOSIS — J309 Allergic rhinitis, unspecified: Secondary | ICD-10-CM | POA: Diagnosis not present

## 2018-02-28 DIAGNOSIS — I10 Essential (primary) hypertension: Secondary | ICD-10-CM | POA: Diagnosis not present

## 2018-02-28 DIAGNOSIS — D692 Other nonthrombocytopenic purpura: Secondary | ICD-10-CM | POA: Diagnosis not present

## 2018-02-28 DIAGNOSIS — E782 Mixed hyperlipidemia: Secondary | ICD-10-CM | POA: Diagnosis not present

## 2018-02-28 DIAGNOSIS — Z794 Long term (current) use of insulin: Secondary | ICD-10-CM | POA: Diagnosis not present

## 2018-02-28 DIAGNOSIS — Z0000 Encounter for general adult medical examination without abnormal findings: Secondary | ICD-10-CM | POA: Diagnosis not present

## 2018-02-28 DIAGNOSIS — E1129 Type 2 diabetes mellitus with other diabetic kidney complication: Secondary | ICD-10-CM | POA: Diagnosis not present

## 2018-02-28 DIAGNOSIS — J449 Chronic obstructive pulmonary disease, unspecified: Secondary | ICD-10-CM | POA: Diagnosis not present

## 2018-02-28 DIAGNOSIS — N182 Chronic kidney disease, stage 2 (mild): Secondary | ICD-10-CM | POA: Diagnosis not present

## 2018-03-22 DIAGNOSIS — M722 Plantar fascial fibromatosis: Secondary | ICD-10-CM | POA: Diagnosis not present

## 2018-03-22 DIAGNOSIS — M71572 Other bursitis, not elsewhere classified, left ankle and foot: Secondary | ICD-10-CM | POA: Diagnosis not present

## 2018-03-29 DIAGNOSIS — M71572 Other bursitis, not elsewhere classified, left ankle and foot: Secondary | ICD-10-CM | POA: Diagnosis not present

## 2018-03-29 DIAGNOSIS — M722 Plantar fascial fibromatosis: Secondary | ICD-10-CM | POA: Diagnosis not present

## 2018-04-05 DIAGNOSIS — M71572 Other bursitis, not elsewhere classified, left ankle and foot: Secondary | ICD-10-CM | POA: Diagnosis not present

## 2018-04-05 DIAGNOSIS — M722 Plantar fascial fibromatosis: Secondary | ICD-10-CM | POA: Diagnosis not present

## 2018-06-08 DIAGNOSIS — J111 Influenza due to unidentified influenza virus with other respiratory manifestations: Secondary | ICD-10-CM | POA: Diagnosis not present

## 2018-11-30 DIAGNOSIS — E1159 Type 2 diabetes mellitus with other circulatory complications: Secondary | ICD-10-CM | POA: Diagnosis not present

## 2018-11-30 DIAGNOSIS — E119 Type 2 diabetes mellitus without complications: Secondary | ICD-10-CM | POA: Diagnosis not present

## 2018-11-30 DIAGNOSIS — E669 Obesity, unspecified: Secondary | ICD-10-CM | POA: Diagnosis not present

## 2018-11-30 DIAGNOSIS — E785 Hyperlipidemia, unspecified: Secondary | ICD-10-CM | POA: Diagnosis not present

## 2018-12-01 DIAGNOSIS — Z794 Long term (current) use of insulin: Secondary | ICD-10-CM | POA: Diagnosis not present

## 2018-12-01 DIAGNOSIS — E119 Type 2 diabetes mellitus without complications: Secondary | ICD-10-CM | POA: Diagnosis not present

## 2018-12-01 DIAGNOSIS — Z125 Encounter for screening for malignant neoplasm of prostate: Secondary | ICD-10-CM | POA: Diagnosis not present

## 2018-12-01 DIAGNOSIS — E785 Hyperlipidemia, unspecified: Secondary | ICD-10-CM | POA: Diagnosis not present

## 2018-12-28 DIAGNOSIS — E785 Hyperlipidemia, unspecified: Secondary | ICD-10-CM | POA: Diagnosis not present

## 2018-12-28 DIAGNOSIS — I10 Essential (primary) hypertension: Secondary | ICD-10-CM | POA: Diagnosis not present

## 2018-12-28 DIAGNOSIS — E1159 Type 2 diabetes mellitus with other circulatory complications: Secondary | ICD-10-CM | POA: Diagnosis not present

## 2019-03-02 DIAGNOSIS — J449 Chronic obstructive pulmonary disease, unspecified: Secondary | ICD-10-CM | POA: Diagnosis not present

## 2019-03-02 DIAGNOSIS — E1159 Type 2 diabetes mellitus with other circulatory complications: Secondary | ICD-10-CM | POA: Diagnosis not present

## 2019-03-02 DIAGNOSIS — I499 Cardiac arrhythmia, unspecified: Secondary | ICD-10-CM | POA: Diagnosis not present

## 2019-03-02 DIAGNOSIS — E785 Hyperlipidemia, unspecified: Secondary | ICD-10-CM | POA: Diagnosis not present

## 2019-03-05 DIAGNOSIS — I493 Ventricular premature depolarization: Secondary | ICD-10-CM | POA: Diagnosis not present

## 2019-03-05 DIAGNOSIS — R9431 Abnormal electrocardiogram [ECG] [EKG]: Secondary | ICD-10-CM | POA: Diagnosis not present

## 2019-04-10 DIAGNOSIS — U071 COVID-19: Secondary | ICD-10-CM | POA: Diagnosis not present

## 2019-04-10 DIAGNOSIS — J1289 Other viral pneumonia: Secondary | ICD-10-CM | POA: Diagnosis not present

## 2019-04-10 DIAGNOSIS — Z20828 Contact with and (suspected) exposure to other viral communicable diseases: Secondary | ICD-10-CM | POA: Diagnosis not present

## 2019-04-10 DIAGNOSIS — R0602 Shortness of breath: Secondary | ICD-10-CM | POA: Diagnosis not present

## 2019-04-25 DIAGNOSIS — Z794 Long term (current) use of insulin: Secondary | ICD-10-CM | POA: Diagnosis not present

## 2019-04-25 DIAGNOSIS — E119 Type 2 diabetes mellitus without complications: Secondary | ICD-10-CM | POA: Diagnosis not present

## 2019-04-25 DIAGNOSIS — U071 COVID-19: Secondary | ICD-10-CM | POA: Diagnosis not present

## 2019-04-25 DIAGNOSIS — J449 Chronic obstructive pulmonary disease, unspecified: Secondary | ICD-10-CM | POA: Diagnosis not present

## 2019-05-01 DIAGNOSIS — J189 Pneumonia, unspecified organism: Secondary | ICD-10-CM | POA: Diagnosis not present

## 2019-05-01 DIAGNOSIS — U071 COVID-19: Secondary | ICD-10-CM | POA: Diagnosis not present

## 2019-05-01 DIAGNOSIS — J441 Chronic obstructive pulmonary disease with (acute) exacerbation: Secondary | ICD-10-CM | POA: Diagnosis not present

## 2019-05-01 DIAGNOSIS — R0602 Shortness of breath: Secondary | ICD-10-CM | POA: Diagnosis not present

## 2022-09-07 ENCOUNTER — Encounter (HOSPITAL_COMMUNITY): Payer: Self-pay | Admitting: Emergency Medicine

## 2022-09-07 ENCOUNTER — Emergency Department (HOSPITAL_COMMUNITY)
Admission: EM | Admit: 2022-09-07 | Discharge: 2022-09-18 | Disposition: E | Attending: Emergency Medicine | Admitting: Emergency Medicine

## 2022-09-07 DIAGNOSIS — I469 Cardiac arrest, cause unspecified: Secondary | ICD-10-CM | POA: Insufficient documentation

## 2022-09-07 DIAGNOSIS — Z7982 Long term (current) use of aspirin: Secondary | ICD-10-CM | POA: Diagnosis not present

## 2022-09-18 DIAGNOSIS — 419620001 Death: Secondary | SNOMED CT

## 2022-09-18 NOTE — ED Provider Notes (Signed)
[place] EMERGENCY DEPARTMENT AT [facility] Provider Note   CSN: [id number] Arrival date & time: 09/06/2022  09-25-12     History  Chief Complaint  Patient presents with   Dead On Arrival    Juan Romero is a 82 y.o. male presenting by EMS in cardiac arrest.  The patient was brought from home on home hospice.  EMS reports that the patient was having difficulty breathing, they were called onto the scene.  The patient's family requested that he be brought to the hospital, confirmed on comfort care measures.  The patient went into cardiac arrest en route to the hospital.  Patient was pronounced dead by paramedics at September 26, 2103, prior to arrival in the hospital.    HPI     Home Medications Prior to Admission medications   Medication Sig Start Date End Date Taking? Authorizing Provider  amLODipine (NORVASC) 10 MG tablet Take 10 mg by mouth daily.    [provider]  aspirin 81 MG tablet Take 81 mg by mouth daily.    [provider]  cetirizine (ZYRTEC) 10 MG tablet Take 10 mg by mouth daily.    [provider]  ciprofloxacin (CILOXAN) 0.3 % ophthalmic solution  07/11/13   [provider]  fluticasone (FLONASE) 50 MCG/ACT nasal spray Place into both nostrils daily.    [provider]  ketorolac (ACULAR) 0.4 % SOLN  07/11/13   [provider]  lisinopril-hydrochlorothiazide (PRINZIDE,ZESTORETIC) 20-25 MG per tablet Take 1 tablet by mouth daily.    [provider]  metformin (FORTAMET) 1000 MG (OSM) 24 hr tablet Take 1,000 mg by mouth daily with breakfast.    [provider]  pioglitazone (ACTOS) 15 MG tablet Take 15 mg by mouth daily.    [provider]  prednisoLONE acetate (PRED FORTE) 1 % ophthalmic suspension  07/11/13   [provider]  simvastatin (ZOCOR) 40 MG tablet Take 40 mg by mouth daily.    [provider]      Allergies    Patient has no known allergies.    Review of  Systems   Review of Systems  Physical Exam Updated Vital Signs There were no vitals taken for this visit. Physical Exam Constitutional:      Comments: Unresponsive  Eyes:     Comments: Pupils are fixed and dilated  Cardiovascular:     Comments: No audible heartbeat, no palpable pulse Pulmonary:     Comments: Apneic Neurological:     Comments: No gag reflex or corneal blink reflex     ED Results / Procedures / Treatments   Labs (all labs ordered are listed, but only abnormal results are displayed) Labs Reviewed - No data to display  EKG None  Radiology No results found.  Procedures Procedures    Medications Ordered in ED Medications - No data to display  ED Course/ Medical Decision Making/ A&P                             Medical Decision Making  Patient arrives by EMS, pronounced dead in the field en route to the hospital.   I have confirmed with EMS that they will be filing death certificate paperwork, or else the hospice director will be doing this, as the patient died prior to arriving in the hospital.  I have notified the patient's family, son Juan Romero, by phone about his passing.  Final Clinical Impression(s) / ED Diagnoses Final diagnoses:  Dead on arrival    Rx / DC Orders ED Discharge Orders     None         Terald Sleeper, MD 09/10/2022 2129

## 2022-09-18 NOTE — ED Notes (Signed)
Belongings include hospice book.

## 2022-09-18 NOTE — ED Notes (Signed)
Patient belongings secured and placed in belongings bag.  1 black pants 1 white shirt 1 pair white socks 1 black belt 1 white underwear 1 white jock strap 1 green watch 1 wallet with no contents

## 2022-09-18 NOTE — ED Notes (Signed)
MD Trifan at bedside 

## 2022-09-18 NOTE — ED Triage Notes (Addendum)
Patient BIB [facility] from home on hospice for bradycardia.  Patient pulseless and apneic upon arrival.  EMS reports patient expired enroute. Patient has active DNR with him.   TOD per EMS 21:05:06

## 2022-09-18 DEATH — deceased
